# Patient Record
Sex: Female | Born: 1960 | Race: White | Hispanic: No | Smoking: Never smoker
Health system: Southern US, Community
[De-identification: ages and names within clinical notes are randomized; demographics above are authoritative.]

## PROBLEM LIST (undated history)

## (undated) DIAGNOSIS — F32A Depression, unspecified: Secondary | ICD-10-CM

## (undated) DIAGNOSIS — G35 Multiple sclerosis: Secondary | ICD-10-CM

## (undated) DIAGNOSIS — F329 Major depressive disorder, single episode, unspecified: Secondary | ICD-10-CM

## (undated) DIAGNOSIS — I82409 Acute embolism and thrombosis of unspecified deep veins of unspecified lower extremity: Secondary | ICD-10-CM

## (undated) DIAGNOSIS — E039 Hypothyroidism, unspecified: Secondary | ICD-10-CM

## (undated) HISTORY — DX: Hypothyroidism, unspecified: E03.9

## (undated) HISTORY — DX: Depression, unspecified: F32.A

## (undated) HISTORY — DX: Acute embolism and thrombosis of unspecified deep veins of unspecified lower extremity: I82.409

## (undated) HISTORY — DX: Major depressive disorder, single episode, unspecified: F32.9

## (undated) HISTORY — DX: Multiple sclerosis: G35

---

## 2014-09-04 ENCOUNTER — Non-Acute Institutional Stay (SKILLED_NURSING_FACILITY): Payer: Medicare (Managed Care) | Admitting: Internal Medicine

## 2014-09-04 ENCOUNTER — Encounter: Payer: Self-pay | Admitting: Internal Medicine

## 2014-09-04 DIAGNOSIS — E038 Other specified hypothyroidism: Secondary | ICD-10-CM

## 2014-09-04 DIAGNOSIS — I82409 Acute embolism and thrombosis of unspecified deep veins of unspecified lower extremity: Secondary | ICD-10-CM | POA: Diagnosis not present

## 2014-09-04 DIAGNOSIS — F329 Major depressive disorder, single episode, unspecified: Secondary | ICD-10-CM | POA: Diagnosis not present

## 2014-09-04 DIAGNOSIS — E039 Hypothyroidism, unspecified: Secondary | ICD-10-CM | POA: Insufficient documentation

## 2014-09-04 DIAGNOSIS — E034 Atrophy of thyroid (acquired): Secondary | ICD-10-CM

## 2014-09-04 DIAGNOSIS — F32A Depression, unspecified: Secondary | ICD-10-CM | POA: Insufficient documentation

## 2014-09-04 DIAGNOSIS — G35 Multiple sclerosis: Secondary | ICD-10-CM | POA: Diagnosis not present

## 2014-09-04 NOTE — Assessment & Plan Note (Addendum)
Pt receives daily copaxone; need to get her set up with neurology/MS provider; on baclofen, valium also

## 2014-09-04 NOTE — Progress Notes (Signed)
MRN: [id number] Name: Jessica Chung  Sex: female Age: 54 y.o. DOB: [birth year]  [facility] Facility/Room:419 Level Of Care: SNF Provider: Merrilee Seashore D Emergency Contacts: No emergency contact information on file.  Code Status:   Allergies: Review of patient's allergies indicates not on file.  Chief Complaint  Patient presents with  . New Admit To SNF    HPI: Patient is 54 y.o. female with MS who is being admitted to SNF from [place] as a resident.  Past Medical History  Diagnosis Date  . MS (multiple sclerosis)   . Hypothyroid   . Depression   . DVT (deep venous thrombosis)     History reviewed. No pertinent past surgical history.    Medication List       This list is accurate as of: 09/04/14 11:59 PM.  Always use your most recent med list.               aspirin 81 MG tablet  Take 81 mg by mouth daily.     baclofen 20 MG tablet  Commonly known as:  LIORESAL  Take 20 mg by mouth at bedtime.     BENEFIBER Powd  Take by mouth 3 (three) times daily.     Calcium Carbonate-Vitamin D 600-400 MG-UNIT per tablet  Take 1 tablet by mouth.     Cranberry Fruit Concentrate 12600 MG Caps  Take 50,000 mg by mouth daily.     diazepam 5 MG tablet  Commonly known as:  VALIUM  Take 5 mg by mouth daily.     docusate sodium 100 MG capsule  Commonly known as:  COLACE  Take 100 mg by mouth 2 (two) times daily.     Fish Oil 1000 MG Caps  Take by mouth daily.     FLUoxetine 10 MG capsule  Commonly known as:  PROZAC  Take 10 mg by mouth daily.     glatiramer 20 MG/ML Sosy injection  Commonly known as:  COPAXONE  Inject 20 mg into the skin daily.     levothyroxine 150 MCG tablet  Commonly known as:  SYNTHROID, LEVOTHROID  Take 150 mcg by mouth daily before breakfast.     Melatonin 3 MG Caps  Take by mouth at bedtime.     multivitamin with minerals tablet  Take 1 tablet by mouth daily.     polyethylene glycol packet  Commonly known as:  MIRALAX /  GLYCOLAX  Take 17 g by mouth daily.     warfarin 10 MG tablet  Commonly known as:  COUMADIN  Take 10 mg by mouth daily.     warfarin 2.5 MG tablet  Commonly known as:  COUMADIN  Take 2.5 mg by mouth daily. Mon and Fri for total 12.5 mg        Meds ordered this encounter  Medications  . warfarin (COUMADIN) 10 MG tablet    Sig: Take 10 mg by mouth daily.   Marland Kitchen warfarin (COUMADIN) 2.5 MG tablet    Sig: Take 2.5 mg by mouth daily. Mon and Fri for total 12.5 mg  . aspirin 81 MG tablet    Sig: Take 81 mg by mouth daily.  . diazepam (VALIUM) 5 MG tablet    Sig: Take 5 mg by mouth daily.  Marland Kitchen glatiramer (COPAXONE) 20 MG/ML SOSY injection    Sig: Inject 20 mg into the skin daily.  . Calcium Carbonate-Vitamin D 600-400 MG-UNIT per tablet    Sig: Take 1 tablet by mouth.  Marland Kitchen  Wheat Dextrin (BENEFIBER) POWD    Sig: Take by mouth 3 (three) times daily.  . baclofen (LIORESAL) 20 MG tablet    Sig: Take 20 mg by mouth at bedtime.  . Multiple Vitamins-Minerals (MULTIVITAMIN WITH MINERALS) tablet    Sig: Take 1 tablet by mouth daily.  Marland Kitchen levothyroxine (SYNTHROID, LEVOTHROID) 150 MCG tablet    Sig: Take 150 mcg by mouth daily before breakfast.  . FLUoxetine (PROZAC) 10 MG capsule    Sig: Take 10 mg by mouth daily.  . Omega-3 Fatty Acids (FISH OIL) 1000 MG CAPS    Sig: Take by mouth daily.  Marland Kitchen docusate sodium (COLACE) 100 MG capsule    Sig: Take 100 mg by mouth 2 (two) times daily.  . Melatonin 3 MG CAPS    Sig: Take by mouth at bedtime.  . Cranberry Fruit Concentrate 12600 MG CAPS    Sig: Take 50,000 mg by mouth daily.  . polyethylene glycol (MIRALAX / GLYCOLAX) packet    Sig: Take 17 g by mouth daily.     There is no immunization history on file for this patient.  History  Substance Use Topics  . Smoking status: Never Smoker   . Smokeless tobacco: Not on file  . Alcohol Use: No    Family history is noncontributory    Review of Systems  DATA OBTAINED: from patient, medical record,  family member GENERAL:  no fevers, fatigue, appetite changes SKIN: No itching, rash or wounds EYES: No eye pain, redness, discharge EARS: No earache, tinnitus, change in hearing NOSE: No congestion, drainage or bleeding  MOUTH/THROAT: No mouth or tooth pain, No sore throat RESPIRATORY: No cough, wheezing, SOB CARDIAC: No chest pain, palpitations, lower extremity edema  GI: No abdominal pain, No N/V/D or constipation, No heartburn or reflux  GU: No dysuria, frequency or urgency, or incontinence  MUSCULOSKELETAL: No unrelieved bone/joint pain NEUROLOGIC: No headache, dizziness or focal weakness;sister says she has some memory problems PSYCHIATRIC: No overt anxiety or sadness, No behavior issue.   There were no vitals filed for this visit.  Physical Exam  GENERAL APPEARANCE: Alert, conversant,  No acute distress.  SKIN: No diaphoresis rash HEAD: Normocephalic, atraumatic  EYES: Conjunctiva/lids clear. Pupils round, reactive. EOMs intact.  EARS: External exam WNL, canals clear. Hearing grossly normal.  NOSE: No deformity or discharge.  MOUTH/THROAT: Lips w/o lesions  RESPIRATORY: Breathing is even, unlabored. Lung sounds are clear   CARDIOVASCULAR: Heart RRR no murmurs, rubs or gallops. No peripheral edema.   GASTROINTESTINAL: Abdomen is soft, non-tender, not distended w/ normal bowel sounds. GENITOURINARY: Bladder non tender, not distended  MUSCULOSKELETAL: No abnormal joints or musculature NEUROLOGIC:  Cranial nerves 2-12 grossly intact.No LE movement PSYCHIATRIC: Mood and affect appropriate to situation, no behavioral issues  Patient Active Problem List   Diagnosis Date Noted  . MS (multiple sclerosis) 09/04/2014  . DVT (deep venous thrombosis) 09/04/2014  . Hypothyroidism 09/04/2014  . Depression 09/04/2014        Assessment and Plan  MS (multiple sclerosis) Pt receives daily copaxone; need to get her set up with neurology/MS provider; on baclofen, valium also   DVT  (deep venous thrombosis) Coumadin as prophylaxis   Hypothyroidism 150 mcg daily as replacement   Depression Chronic illness; prozac 10 mg daily     Margit Hanks, MD

## 2014-09-04 NOTE — Assessment & Plan Note (Signed)
Chronic illness; prozac 10 mg daily

## 2014-09-04 NOTE — Assessment & Plan Note (Signed)
150 mcg daily as replacement

## 2014-09-04 NOTE — Assessment & Plan Note (Addendum)
Coumadin as prophylaxis;pt does not know when DVT was dx;hopefully her neuro in [place] has it on a note that will be sent to Neuro here in [place]

## 2014-09-16 ENCOUNTER — Encounter: Payer: Self-pay | Admitting: Internal Medicine

## 2014-09-23 ENCOUNTER — Non-Acute Institutional Stay (SKILLED_NURSING_FACILITY): Payer: Medicare (Managed Care) | Admitting: Internal Medicine

## 2014-09-23 DIAGNOSIS — R21 Rash and other nonspecific skin eruption: Secondary | ICD-10-CM | POA: Diagnosis not present

## 2014-09-23 DIAGNOSIS — E038 Other specified hypothyroidism: Secondary | ICD-10-CM | POA: Diagnosis not present

## 2014-09-23 DIAGNOSIS — G35 Multiple sclerosis: Secondary | ICD-10-CM

## 2014-09-23 NOTE — Progress Notes (Signed)
Patient ID: Jessica Chung, female   DOB: [birth year], 54 y.o.   MRN: [id number] MRN: [id number] Name: Jessica Chung  Sex: female Age: 54 y.o. DOB: Dec 31, 1960  [facility] Facility/Room:419 Level Of Care: SNF Provider: Roena Malady Emergency Contacts: Extended Emergency Contact Information Primary Emergency Contact: [facility] Address: [address] [place], [place] [address] [place] Home Phone: [phone] Mobile Phone: [phone] Relation: Sister Secondary Emergency Contact: Clelia Schaumann [place] Home Phone: [phone] Mobile Phone: [phone] Relation: Mother  Code Status:   Allergies: Review of patient's allergies indicates not on file.  Chief Complaint  Patient presents with  . Acute Visit   secondary to facial rash  HPI: Patient is 54 y.o. female with MS who was recently admitted to this facility from another facility in [place] was transferred apparently be closer to family  Apparently over the weekend she developed a fairly significant facial rash with scaling-this does not really hurt there has been no itching according the patient but it it does appear fairly significant-.  According to staff and her daughter this actually appears a little improved compared to yesterday.  Vital signs are stable she's been afebrile she does not really have any complaints about this.  Apparently there's been no different lotion soap or topical treatment to her face.  .  .  Past Medical History  Diagnosis Date  . MS (multiple sclerosis)   . Hypothyroid   . Depression   . DVT (deep venous thrombosis)     No past surgical history on file.    Medication List       This list is accurate as of: 09/23/14 11:59 PM.  Always use your most recent med list.               aspirin 81 MG tablet  Take 81 mg by mouth daily.     baclofen 20 MG tablet  Commonly known as:  LIORESAL  Take 20 mg by mouth at bedtime.      BENEFIBER Powd  Take by mouth 3 (three) times daily.     Calcium Carbonate-Vitamin D 600-400 MG-UNIT per tablet  Take 1 tablet by mouth.     Cranberry Fruit Concentrate 12600 MG Caps  Take 50,000 mg by mouth daily.     diazepam 5 MG tablet  Commonly known as:  VALIUM  Take 5 mg by mouth daily.     docusate sodium 100 MG capsule  Commonly known as:  COLACE  Take 100 mg by mouth 2 (two) times daily.     Fish Oil 1000 MG Caps  Take by mouth daily.     FLUoxetine 10 MG capsule  Commonly known as:  PROZAC  Take 10 mg by mouth daily.     glatiramer 20 MG/ML Sosy injection  Commonly known as:  COPAXONE  Inject 20 mg into the skin daily.     levothyroxine 150 MCG tablet  Commonly known as:  SYNTHROID, LEVOTHROID  Take 150 mcg by mouth daily before breakfast.     Melatonin 3 MG Caps  Take by mouth at bedtime.     multivitamin with minerals tablet  Take 1 tablet by mouth daily.     polyethylene glycol packet  Commonly known as:  MIRALAX / GLYCOLAX  Take 17 g by mouth daily.     warfarin 10 MG tablet  Commonly known as:  COUMADIN  Take 10 mg by  mouth daily.     warfarin 2.5 MG tablet  Commonly known as:  COUMADIN  Take 2.5 mg by mouth daily. Mon and Fri for total 12.5 mg        No orders of the defined types were placed in this encounter.     There is no immunization history on file for this patient.  History  Substance Use Topics  . Smoking status: Never Smoker   . Smokeless tobacco: Not on file  . Alcohol Use: No    Family history is noncontributory    Review of Systems  DATA OBTAINED: from patient, medical record, family member GENERAL:  no fevers, fatigue, appetite changes SKIN: No itching, and has a significant facial rash noted above there is no itching EYES: No eye pain, redness, discharge EARS: No earache, tinnitus, change in hearing NOSE: No congestion, drainage or bleeding  MOUTH/THROAT: No mouth or tooth pain, No sore throat RESPIRATORY: No  cough, wheezing, SOB CARDIAC: No chest pain, palpitations, lower extremity edema  GI: No abdominal pain, No N/V/D or constipation, No heartburn or reflux  GU: No dysuria, frequency or urgency, or incontinence  MUSCULOSKELETAL: No unrelieved bone/joint pain NEUROLOGIC: No headache, dizziness or focal weakness;sister says she has some memory problems PSYCHIATRIC: No overt anxiety or sadness, No behavior issue.     Physical Exam Vital signs are stable she is afebrile GENERAL APPEARANCE: Alert, conversant,  No acute distress.  SKIN: No diaphoresis --she has an erythematous rash that extends from under the orbital area bilaterally to the jaw line- does not really extend to her fore head or neck-there is quite a bit of scaling here there is no drainage or bleeding HEAD: Normocephalic, atraumatic  EYES: Conjunctiva/lids clear. Pupils round, reactive. EOMs intact.  EARS: External exam WNL, canals clear. Hearing grossly normal.  NOSE: No deformity or discharge.  MOUTH/THROAT: Lips w/o lesions  RESPIRATORY: Breathing is even, unlabored. Lung sounds are clear   CARDIOVASCULAR: Heart RRR no murmurs, rubs or gallops. Has baseline  Trace  lower extremity edema obese changes-  GASTROINTESTINAL: Abdomen is soft, non-tender, not distended w/ normal bowel sounds. GENITOURINARY: Bladder non tender, not distended  MUSCULOSKELETAL: No abnormal joints or musculature NEUROLOGIC:  Cranial nerves 2-12 grossly intact.No LE movement PSYCHIATRIC: Mood and affect appropriate to situation, no behavioral issues  Patient Active Problem List   Diagnosis Date Noted  . Rash and nonspecific skin eruption 09/23/2014  . MS (multiple sclerosis) 09/04/2014  . DVT (deep venous thrombosis) 09/04/2014  . Hypothyroidism 09/04/2014  . Depression 09/04/2014     Labs.  09/05/2014.  WBC 7.6 hemoglobin 11.9 platelets 246.  Hemoglobin A1c 5.5.  Sodium 140 potassium 3.9 BUN 18 creatinine  0.8.  TSH-4.153     Assessment and Plan  #1-facial rash-unknown etiology-this is quite puzzling-I did discuss this with her sister at bedside-family also contacted her mother in [place]-according to her mother she actually has had this [place]-it resolved apparently spontaneously-no etiology was found despite apparently being followed by primary care provider in [place].  At this point will write orders to monitor this with vital signs every shift for 24 hours to keep an eye on this-will order a dermatology consult and again monitor for any changes any sign that this could be cellulitic although this does not appear to be the case currently.    2 MS-this appears to be stable she is on baclofen as well as Valium andCapoxone    #3-hypothyroidism-recent TSH within normal  limits she is on Synthroid.  [id number] note greater than 30 minutes spent assessing patient-reviewing her chart-discussing her status with family at bedside-and coordinating and formulating a plan of care-of note greater than 50% of time spent coordinating plan of care with family input      ,

## 2014-10-07 ENCOUNTER — Inpatient Hospital Stay (HOSPITAL_COMMUNITY): Payer: Medicare (Managed Care)

## 2014-10-07 ENCOUNTER — Emergency Department (HOSPITAL_COMMUNITY): Payer: Medicare (Managed Care) | Admitting: Anesthesiology

## 2014-10-07 ENCOUNTER — Inpatient Hospital Stay (HOSPITAL_COMMUNITY)
Admission: EM | Admit: 2014-10-07 | Discharge: 2014-10-23 | DRG: 004 | Disposition: E | Payer: Medicare (Managed Care) | Attending: Otolaryngology | Admitting: Otolaryngology

## 2014-10-07 ENCOUNTER — Encounter (HOSPITAL_COMMUNITY): Admission: EM | Disposition: E | Payer: Self-pay | Source: Home / Self Care | Attending: Otolaryngology

## 2014-10-07 ENCOUNTER — Other Ambulatory Visit (HOSPITAL_COMMUNITY): Payer: Medicare Other

## 2014-10-07 DIAGNOSIS — Z66 Do not resuscitate: Secondary | ICD-10-CM | POA: Diagnosis present

## 2014-10-07 DIAGNOSIS — Z515 Encounter for palliative care: Secondary | ICD-10-CM

## 2014-10-07 DIAGNOSIS — T7809XA Anaphylactic reaction due to other food products, initial encounter: Secondary | ICD-10-CM | POA: Diagnosis present

## 2014-10-07 DIAGNOSIS — Z419 Encounter for procedure for purposes other than remedying health state, unspecified: Secondary | ICD-10-CM

## 2014-10-07 DIAGNOSIS — I469 Cardiac arrest, cause unspecified: Secondary | ICD-10-CM | POA: Diagnosis present

## 2014-10-07 DIAGNOSIS — T17420A Food in trachea causing asphyxiation, initial encounter: Secondary | ICD-10-CM

## 2014-10-07 DIAGNOSIS — R0902 Hypoxemia: Secondary | ICD-10-CM | POA: Diagnosis present

## 2014-10-07 DIAGNOSIS — E872 Acidosis: Secondary | ICD-10-CM | POA: Diagnosis present

## 2014-10-07 DIAGNOSIS — R579 Shock, unspecified: Secondary | ICD-10-CM | POA: Diagnosis present

## 2014-10-07 DIAGNOSIS — R092 Respiratory arrest: Secondary | ICD-10-CM | POA: Diagnosis present

## 2014-10-07 DIAGNOSIS — T782XXA Anaphylactic shock, unspecified, initial encounter: Secondary | ICD-10-CM | POA: Insufficient documentation

## 2014-10-07 DIAGNOSIS — J9601 Acute respiratory failure with hypoxia: Secondary | ICD-10-CM | POA: Diagnosis not present

## 2014-10-07 DIAGNOSIS — G35 Multiple sclerosis: Secondary | ICD-10-CM | POA: Diagnosis present

## 2014-10-07 DIAGNOSIS — T17920A Food in respiratory tract, part unspecified causing asphyxiation, initial encounter: Secondary | ICD-10-CM | POA: Diagnosis present

## 2014-10-07 DIAGNOSIS — J398 Other specified diseases of upper respiratory tract: Secondary | ICD-10-CM | POA: Diagnosis present

## 2014-10-07 DIAGNOSIS — E039 Hypothyroidism, unspecified: Secondary | ICD-10-CM | POA: Diagnosis present

## 2014-10-07 HISTORY — PX: TRACHEOSTOMY REVISION: SHX6133

## 2014-10-07 LAB — BLOOD GAS, ARTERIAL
Acid-Base Excess: 4 mmol/L — ABNORMAL HIGH (ref 0.0–2.0)
Bicarbonate: 36 mEq/L — ABNORMAL HIGH (ref 20.0–24.0)
FIO2: 1 %
O2 SAT: 42.5 %
PH ART: 6.956 — AB (ref 7.350–7.450)
Patient temperature: 98.6
TCO2: 41.2 mmol/L (ref 0–100)
pO2, Arterial: 40 mmHg — ABNORMAL LOW (ref 80.0–100.0)

## 2014-10-07 SURGERY — REVISION, STOMA, TRACHEA
Anesthesia: General | Site: Neck

## 2014-10-07 MED ORDER — ROCURONIUM BROMIDE 100 MG/10ML IV SOLN
INTRAVENOUS | Status: DC | PRN
  Administered 2014-10-07 (×2): 50 mg via INTRAVENOUS

## 2014-10-07 MED ORDER — SODIUM BICARBONATE 8.4 % IV SOLN
INTRAVENOUS | Status: DC | PRN
  Administered 2014-10-07 (×10): 50 meq via INTRAVENOUS

## 2014-10-07 MED ORDER — EPINEPHRINE HCL 0.1 MG/ML IJ SOSY
PREFILLED_SYRINGE | INTRAMUSCULAR | Status: AC
  Filled 2014-10-07: qty 20

## 2014-10-07 MED ORDER — EPHEDRINE SULFATE 50 MG/ML IJ SOLN
INTRAMUSCULAR | Status: AC
  Filled 2014-10-07: qty 1

## 2014-10-07 MED ORDER — PHENYLEPHRINE 40 MCG/ML (10ML) SYRINGE FOR IV PUSH (FOR BLOOD PRESSURE SUPPORT)
PREFILLED_SYRINGE | INTRAVENOUS | Status: AC
  Filled 2014-10-07: qty 10

## 2014-10-07 MED ORDER — MORPHINE SULFATE 2 MG/ML IJ SOLN
INTRAMUSCULAR | Status: AC
  Filled 2014-10-07: qty 1

## 2014-10-07 MED ORDER — SODIUM CHLORIDE 0.9 % IV SOLN
INTRAVENOUS | Status: DC | PRN
  Administered 2014-10-07: 14:00:00 via INTRAVENOUS

## 2014-10-07 MED ORDER — LACTATED RINGERS IV SOLN
INTRAVENOUS | Status: DC | PRN
  Administered 2014-10-07 (×2): via INTRAVENOUS

## 2014-10-07 MED ORDER — MORPHINE SULFATE 4 MG/ML IJ SOLN: 5.0000 mg | INTRAMUSCULAR | Status: DC | PRN

## 2014-10-07 MED ORDER — HEMOSTATIC AGENTS (NO CHARGE) OPTIME
TOPICAL | Status: DC | PRN
  Administered 2014-10-07: 1 via TOPICAL

## 2014-10-07 MED ORDER — DEXTROSE 5 % IV SOLN
0.5000 ug/min | INTRAVENOUS | Status: DC
  Administered 2014-10-07: 8 ug/min via INTRAVENOUS
  Filled 2014-10-07: qty 4

## 2014-10-07 MED ORDER — STERILE WATER FOR INJECTION IJ SOLN
INTRAMUSCULAR | Status: AC
Start: 2014-10-07 — End: 2014-10-07
  Filled 2014-10-07: qty 10

## 2014-10-07 MED ORDER — ATROPINE SULFATE 0.1 MG/ML IJ SOLN
INTRAMUSCULAR | Status: AC
Start: 2014-10-07 — End: 2014-10-07
  Filled 2014-10-07: qty 20

## 2014-10-07 MED ORDER — EPINEPHRINE HCL 0.1 MG/ML IJ SOSY
PREFILLED_SYRINGE | INTRAMUSCULAR | Status: DC | PRN
  Administered 2014-10-07: 1 mg via INTRAVENOUS
  Administered 2014-10-07 (×2): 0.5 mg via INTRAVENOUS
  Administered 2014-10-07: 1 mg via INTRAVENOUS

## 2014-10-07 MED ORDER — CALCIUM CHLORIDE 10 % IV SOLN
INTRAVENOUS | Status: AC
  Filled 2014-10-07: qty 10

## 2014-10-07 MED ORDER — LIDOCAINE HCL (CARDIAC) 20 MG/ML IV SOLN
INTRAVENOUS | Status: AC
  Filled 2014-10-07: qty 10

## 2014-10-07 MED ORDER — ROCURONIUM BROMIDE 50 MG/5ML IV SOLN
INTRAVENOUS | Status: AC
  Filled 2014-10-07: qty 2

## 2014-10-07 MED ORDER — EPHEDRINE SULFATE 50 MG/ML IJ SOLN
INTRAMUSCULAR | Status: DC | PRN
  Administered 2014-10-07: 25 mg via INTRAVENOUS

## 2014-10-07 MED ORDER — LACTATED RINGERS IV SOLN
INTRAVENOUS | Status: DC | PRN
  Administered 2014-10-07 (×2): via INTRAVENOUS

## 2014-10-07 MED ORDER — SODIUM BICARBONATE 8.4 % IV SOLN
INTRAVENOUS | Status: AC
  Filled 2014-10-07: qty 50

## 2014-10-07 MED ORDER — MORPHINE SULFATE 2 MG/ML IJ SOLN
INTRAMUSCULAR | Status: AC
  Administered 2014-10-07: 2 mg via INTRAVENOUS
  Filled 2014-10-07: qty 1

## 2014-10-07 MED ORDER — VECURONIUM BROMIDE 10 MG IV SOLR
INTRAVENOUS | Status: AC
  Filled 2014-10-07: qty 30

## 2014-10-07 SURGICAL SUPPLY — 57 items
BLADE 10 SAFETY STRL DISP (BLADE) ×3 IMPLANT
BLADE 11 SAFETY STRL DISP (BLADE) IMPLANT
BLADE 15 SAFETY STRL DISP (BLADE) IMPLANT
BLADE SURG 15 STRL LF DISP TIS (BLADE) IMPLANT
BLADE SURG 15 STRL SS (BLADE)
BLADE SURG ROTATE 9660 (MISCELLANEOUS) IMPLANT
CANISTER SUCTION 2500CC (MISCELLANEOUS) ×3 IMPLANT
CLEANER TIP ELECTROSURG 2X2 (MISCELLANEOUS) ×3 IMPLANT
COVER SURGICAL LIGHT HANDLE (MISCELLANEOUS) ×3 IMPLANT
DECANTER SPIKE VIAL GLASS SM (MISCELLANEOUS) ×3 IMPLANT
DRAPE PROXIMA HALF (DRAPES) IMPLANT
DRAPE U-SHAPE 76X120 STRL (DRAPES) ×3 IMPLANT
ELECT COATED BLADE 2.86 ST (ELECTRODE) ×3 IMPLANT
ELECT REM PT RETURN 9FT ADLT (ELECTROSURGICAL) ×3
ELECTRODE REM PT RTRN 9FT ADLT (ELECTROSURGICAL) ×1 IMPLANT
GAUZE SPONGE 4X4 12PLY STRL (GAUZE/BANDAGES/DRESSINGS) ×3 IMPLANT
GAUZE SPONGE 4X4 16PLY XRAY LF (GAUZE/BANDAGES/DRESSINGS) IMPLANT
GAUZE XEROFORM 5X9 LF (GAUZE/BANDAGES/DRESSINGS) IMPLANT
GLOVE BIO SURGEON STRL SZ7.5 (GLOVE) ×3 IMPLANT
GLOVE BIO SURGEON STRL SZ8 (GLOVE) ×3 IMPLANT
GLOVE BIOGEL PI IND STRL 7.5 (GLOVE) ×1 IMPLANT
GLOVE BIOGEL PI IND STRL 8 (GLOVE) ×1 IMPLANT
GLOVE BIOGEL PI INDICATOR 7.5 (GLOVE) ×2
GLOVE BIOGEL PI INDICATOR 8 (GLOVE) ×2
GLOVE BIOGEL PI ORTHO PRO SZ7 (GLOVE) ×2
GLOVE ECLIPSE 7.5 STRL STRAW (GLOVE) ×3 IMPLANT
GLOVE PI ORTHO PRO STRL SZ7 (GLOVE) ×1 IMPLANT
GLOVE SURG SS PI 6.5 STRL IVOR (GLOVE) ×3 IMPLANT
GOWN STRL REUS W/ TWL LRG LVL3 (GOWN DISPOSABLE) ×3 IMPLANT
GOWN STRL REUS W/TWL LRG LVL3 (GOWN DISPOSABLE) ×6
HEMOSTAT SURGICEL 2X14 (HEMOSTASIS) ×3 IMPLANT
HOLDER TRACH TUBE VELCRO 19.5 (MISCELLANEOUS) IMPLANT
KIT BASIN OR (CUSTOM PROCEDURE TRAY) ×3 IMPLANT
KIT ROOM TURNOVER OR (KITS) ×3 IMPLANT
KIT SUCTION CATH 14FR (SUCTIONS) IMPLANT
NEEDLE HYPO 25GX1X1/2 BEV (NEEDLE) ×3 IMPLANT
NS IRRIG 1000ML POUR BTL (IV SOLUTION) ×3 IMPLANT
PAD ARMBOARD 7.5X6 YLW CONV (MISCELLANEOUS) ×6 IMPLANT
PENCIL BUTTON HOLSTER BLD 10FT (ELECTRODE) IMPLANT
SPONGE DRAIN TRACH 4X4 STRL 2S (GAUZE/BANDAGES/DRESSINGS) ×3 IMPLANT
SPONGE INTESTINAL PEANUT (DISPOSABLE) ×6 IMPLANT
SUT CHROMIC 3 0 SH 27 (SUTURE) IMPLANT
SUT ETHILON 2 0 FS 18 (SUTURE) ×6 IMPLANT
SUT PROLENE 2 0 SH DA (SUTURE) ×6 IMPLANT
SUT SILK 3 0 (SUTURE) ×4
SUT SILK 3-0 18XBRD TIE 12 (SUTURE) ×2 IMPLANT
SYR 20CC LL (SYRINGE) IMPLANT
SYR BULB 3OZ (MISCELLANEOUS) IMPLANT
SYR CONTROL 10ML LL (SYRINGE) IMPLANT
SYRINGE 20CC LL (MISCELLANEOUS) ×3 IMPLANT
TOWEL OR 17X24 6PK STRL BLUE (TOWEL DISPOSABLE) ×6 IMPLANT
TRAY ENT MC OR (CUSTOM PROCEDURE TRAY) ×3 IMPLANT
TUBE CONNECTING 12'X1/4 (SUCTIONS) ×1
TUBE CONNECTING 12X1/4 (SUCTIONS) ×2 IMPLANT
TUBE TRACH SHILEY  6 DIST  CUF (TUBING) ×3 IMPLANT
TUBE TRACH SHILEY 8 DIST CUF (TUBING) ×3 IMPLANT
WATER STERILE IRR 1000ML POUR (IV SOLUTION) ×3 IMPLANT

## 2014-10-09 ENCOUNTER — Encounter (HOSPITAL_COMMUNITY): Payer: Self-pay | Admitting: Otolaryngology

## 2014-10-09 MED FILL — Medication: Qty: 1 | Status: AC

## 2014-10-23 NOTE — Progress Notes (Signed)
Anesthesiology Note:  As noted 54 year old female with multiple sclerosis and a resident of a skilled nursing facility  evidently suffered an anaphylactic reaction and aspirated food. EMS called,  attempted intubation and emergency cricothyroidotomy were unsuccessful. Patient transported to [facility]. Attempts at intubation by anesthesia personnel x 4 in ER were unsuccessful. Dr. Suszanne Conners then performed an emergency cricothyroidotomy in the ER and a 6.0 ETT was placed.  She was transported to  the OR and suffered a cardiac arrest en route in the elevator. Upon arrival in OR patient was cyanotic with copious frothy pink fluid and food particles in endotracheal tube. Active CPR efforts underway. Ventilation  very difficult. Cricothyroidotomy converted to tracheostomy in OR by Dr. Suszanne Conners.   Despite agressive resusitative efforts and attempts to optimize ventilation, patient remained severely hypoxemic, hypercarbic, and acidotic. ICU ventilator brought to OR in attempt to improve oxygenation and ventilation. CXR (-) for pneumothorax.   Patient transported to [facility]M. I notified the family  of the patient's dire condition and the patient's sister (present in ICU) and parents (via telephone)  all agreed that  further aggressive resusitative efforts should not be continued.   Patient then expired in ICU with her sister and sister in law at bedside. Dr. Tyson Alias and ICU nursing staff were present as well.  Kipp Brood, MD

## 2014-10-23 NOTE — Anesthesia Preprocedure Evaluation (Addendum)
Anesthesia Evaluation   Patient unresponsive    Reviewed: Unable to perform ROS - Chart review onlyPreop documentation limited or incomplete due to emergent nature of procedure.  Airway Mallampati: Trach       Dental   Pulmonary  + rhonchi   + decreased breath sounds      Cardiovascular Rhythm:Regular Rate:Tachycardia     Neuro/Psych    GI/Hepatic   Endo/Other    Renal/GU      Musculoskeletal   Abdominal   Peds  Hematology   Anesthesia Other Findings   Reproductive/Obstetrics                            Anesthesia Physical Anesthesia Plan  ASA: V and emergent  Anesthesia Plan: General   Post-op Pain Management:    Induction:   Airway Management Planned:   Additional Equipment:   Intra-op Plan:   Post-operative Plan:   Informed Consent:   Only emergency history available  Plan Discussed with: CRNA and Anesthesiologist  Anesthesia Plan Comments:         Anesthesia Quick Evaluation

## 2014-10-23 NOTE — ED Notes (Signed)
Pt to ED from [facility] via [facility] -- pt had anaphylactic rxn after eating chicken and rice-- staff attempted heimlich and finger sweeps without success. EMS started IV #1 20 g in left hand--- pt received 0.15 EPI x 2 IM , Solumedrol  IV 0.5mg  05/998 IV. Benadryl  IV. O2 sats were 95%-- EMS attempted to intubate-- airway swollen -- unsuccessful, attempted nasal trumpet -- unsuccessful, attempted cric -- unsuccessful.  On arrival to ED-- Anesthesia and 3 CRNAs at bedside, with resp therapy, EDP at bedside. -- pt was trached per Dr. Suszanne Conners- 6.0 ETT placed. Pt transported to OR with OR staff, Dr. Suszanne Conners, Harlene Salts, EMT, and this nurse. Pt lost pulses while in the elevator-- Compressions started-- continued into OR rm 8. Pt care handed over to OR staff.

## 2014-10-23 NOTE — Anesthesia Postprocedure Evaluation (Signed)
  Anesthesia Post-op Note  Patient: Jessica Chung  Procedure(s) Performed: Procedure(s): TRACHEOSTOMY REVISION (N/A)  Patient Location: SICU  Anesthesia Type:General  Level of Consciousness: Patient expired  Airway and Oxygen Therapy: None  Post-op Pain: None  Post-op Assessment: patient expired following sugery  Post-op Vital Signs: None  Last Vitals:  Filed Vitals:   Oct 08, 2014 1338  BP: 120/79  Pulse: 130  Resp: 0    Complications: Patient deceased

## 2014-10-23 NOTE — Progress Notes (Signed)
Utilization Review Completed.Jessica Chung T5/16/2016  

## 2014-10-23 NOTE — Op Note (Signed)
DATE OF PROCEDURE:  10/16/14                              OPERATIVE REPORT  SURGEON:  Newman Pies, MD  PREOPERATIVE DIAGNOSES: 1. Acute respiratory distress 2. Food aspiration  POSTOPERATIVE DIAGNOSES: 1. Acute respiratory distress 2. Food aspiration  PROCEDURE PERFORMED:  Tracheostomy  ANESTHESIA:  General endotracheal tube anesthesia.  COMPLICATIONS:  None.  ESTIMATED BLOOD LOSS:  50ml  INDICATION FOR PROCEDURE:  Jessica Chung is a 54 y.o. female who was emergently transported to the [facility] emergency room by EMS. She was noted to be cyanotic at the nursing home, secondary to food aspiration. Attempts to intubate the patient was unsuccessful. EMS also attempted to perform a cricothyroidotomy without success. An emergency cricothyroidotomy was performed in the emergency room. The patient remained cyanotic with poor CO2 saturation. The decision was made for patient to undergo a tracheostomy tube placement in the operating room.  DESCRIPTION:  The patient was taken to the operating room and placed supine on the operating table. A central line and an artline were placed by anethesia. The neck incision was carefully explored. The thyroid isthmus was ligated at midline and retracted laterally, exposing the anterior tracheal ring. A new tracheal window was placed at the third tracheal ring. The endotracheal tube was withdrawn. A #6 Shiley cuffed tracheostomy tube was placed. Good CO2 return was noted. The position of the trach tube was confirmed using a flexible scope.  OPERATIVE FINDINGS:  Acute respiratory distress due to aspiration.   SPECIMEN:  None.  FOLLOWUP CARE:  The patient will be transferred to ICU in critical condition.  Jessica Chung,SUI W Oct 16, 2014 3:08 PM

## 2014-10-23 NOTE — ED Provider Notes (Signed)
CSN: [id number]     Arrival date & time 2014/10/20  1346 History   First MD Initiated Contact with Patient 10/20/2014 1349     Chief Complaint  Patient presents with  . anaphylactic shock   . Respiratory Arrest     (Consider location/radiation/quality/duration/timing/severity/associated sxs/prior Treatment) HPI Comments: LEVEL 5 EXCEPTION 2/11 ACUITY  54 year old female history of MS comes after food aspiration. Patient is reported to be in a facility when she aspirated during lunch or questionable anaphylactic reaction. Unclear at this time. On EMS arrival per their report there is significant oral swelling and patient was having significant difficulty breathing. Patient was given Solu-Medrol and epinephrine and Benadryl IM and emergency brought to the emergency department. Failed needle cricothyrotomy in field. Patient also questionably in V. tach versus torsade's in field but unable to confirm with EMS. No further information is available.   Past Medical History  Diagnosis Date  . MS (multiple sclerosis)   . Hypothyroid   . Depression   . DVT (deep venous thrombosis)    No past surgical history on file. No family history on file. History  Substance Use Topics  . Smoking status: Never Smoker   . Smokeless tobacco: Not on file  . Alcohol Use: No   OB History    No data available     Review of Systems  Unable to perform ROS: Acuity of condition      Allergies  Review of patient's allergies indicates not on file.  Home Medications   Prior to Admission medications   Medication Sig Start Date End Date Taking? Authorizing Provider  aspirin 81 MG tablet Take 81 mg by mouth daily.    Historical Provider, MD  baclofen (LIORESAL) 20 MG tablet Take 20 mg by mouth at bedtime.    Historical Provider, MD  Calcium Carbonate-Vitamin D 600-400 MG-UNIT per tablet Take 1 tablet by mouth.    Historical Provider, MD  Cranberry Fruit Concentrate 12600 MG CAPS Take 50,000 mg by mouth daily.     Historical Provider, MD  diazepam (VALIUM) 5 MG tablet Take 5 mg by mouth daily.    Historical Provider, MD  docusate sodium (COLACE) 100 MG capsule Take 100 mg by mouth 2 (two) times daily.    Historical Provider, MD  FLUoxetine (PROZAC) 10 MG capsule Take 10 mg by mouth daily.    Historical Provider, MD  glatiramer (COPAXONE) 20 MG/ML SOSY injection Inject 20 mg into the skin daily.    Historical Provider, MD  levothyroxine (SYNTHROID, LEVOTHROID) 150 MCG tablet Take 150 mcg by mouth daily before breakfast.    Historical Provider, MD  Melatonin 3 MG CAPS Take by mouth at bedtime.    Historical Provider, MD  Multiple Vitamins-Minerals (MULTIVITAMIN WITH MINERALS) tablet Take 1 tablet by mouth daily.    Historical Provider, MD  Omega-3 Fatty Acids (FISH OIL) 1000 MG CAPS Take by mouth daily.    Historical Provider, MD  polyethylene glycol (MIRALAX / GLYCOLAX) packet Take 17 g by mouth daily.    Historical Provider, MD  warfarin (COUMADIN) 10 MG tablet Take 10 mg by mouth daily.     Historical Provider, MD  warfarin (COUMADIN) 2.5 MG tablet Take 2.5 mg by mouth daily. Mon and Fri for total 12.5 mg    Historical Provider, MD  Wheat Dextrin (BENEFIBER) POWD Take by mouth 3 (three) times daily.    Historical Provider, MD   BP 120/79 mmHg  Pulse 130  Resp 0 Physical Exam  Constitutional: She  appears distressed.  HENT:  Head: Atraumatic.  Eyes:  3 mm sluggish  Neck:  Minimal air movement with noted stridor  Cardiovascular:  Tachycardia  Pulmonary/Chest: Stridor present. She is in respiratory distress.  Minimal air movement throughout all lung fields  Abdominal: Soft. She exhibits no distension.  Musculoskeletal:  No gross deformity  Neurological:  GCS 3  Skin:  Cyanotic  Psychiatric:  Unable to assess    ED Course  Procedures (including critical care time) Labs Review Labs Reviewed  BLOOD GAS, ARTERIAL - Abnormal; Notable for the following:    pH, Arterial 6.956 (*)    pO2,  Arterial 40.0 (*)    Bicarbonate 36.0 (*)    Acid-Base Excess 4.0 (*)    All other components within normal limits    Imaging Review Dg Cervical Spine 1 View  Oct 26, 2014   CLINICAL DATA:  54 year old female with surgical history of tracheostomy.  EXAM: DG CERVICAL SPINE - 1 VIEW  COMPARISON:  Chest x-ray 2014-10-26  FINDINGS: Single operative cross-table lateral of the upper chest and cervical spine.  Tracheostomy tube projects at the anterior neck. Terminal aspect of the tracheostomy tube not well visualized secondary to overlying soft tissues of the chest.  IMPRESSION: Limited cross-table lateral of the neck demonstrates tracheostomy tube within the anterior cervical soft tissues. The terminal aspect of the tube is not well evaluated secondary to overlying soft tissues of the chest wall.  Please refer to the dictated operative report for full details of intraoperative findings and procedure.  Signed,  Yvone Neu. Loreta Ave, DO  [facility]  [facility]   Electronically Signed   By: Gilmer Mor D.O.   On: 10-26-14 15:20   Dg Chest Portable 1 View  10-26-14   CLINICAL DATA:  Verify tracheal tube placement. Decreasing oxygen saturations perioperatively.  EXAM: PORTABLE CHEST - 1 VIEW  COMPARISON:  None.  FINDINGS: There is an endotracheal tube or tracheostomy tube overlying the trachea at the level of the clavicles.  Diffuse severe bilateral airspace disease is present. Defibrillator pads overlie the chest. The distribution of airspace disease is most compatible with pulmonary edema. Cardiopericardial silhouette is obscured. There is no visible pneumothorax. LEFT subclavian central line is present with the tip in the upper to mid SVC.  IMPRESSION: 1. Tracheal tube is present at the level of the clavicles, overlying the tracheal air column. 2. Other support apparatus appears in satisfactory position. 3. Bilateral airspace disease with a distribution suggesting  pulmonary edema. Large aspiration considered less likely based on the pattern.   Electronically Signed   By: Andreas Newport M.D.   On: October 26, 2014 15:12     EKG Interpretation None      MDM  54 year old female on arrival to the emergency department stopping breathing, minimal air movement via trachea and in all lung fields. Patient was cyanotic but maintaining a pulse. Initial saturations 40s and 50s. Anesthesia and ENT surgery called. Initially attempted one endotracheal intubation. However anesthesia was unable to obtain secondary to swelling and food impactions. Given patient's worsening hypoxia decision was made to perform a cricothyrotomy. Cricothyrotomy incision was started via emergency physicians. ENT arrival completing procedure. Initially a cuff 6-0 ET tube was placed. Continued to have poor saturations. ENT decision then to emergently go to operating room for definitive airway. Despite continued hypoxia patient did maintain pressures and pulse while the emergency department. Unclear if this was simply food impaction up there was anaphylaxis. However on our exam patient had  received 0.8 mg of epinephrine which had improved some of the oral tracheal swelling.    Final diagnoses:  Surgery, elective  Anaphylaxis  PEA (Pulseless electrical activity)        Bridgett Larsson, MD 2014-10-24 1545  Pricilla Loveless, MD 10/09/14 4782

## 2014-10-23 NOTE — Transfer of Care (Signed)
Immediate Anesthesia Transfer of Care Note  Patient: Jessica Chung  Procedure(s) Performed: Procedure(s): TRACHEOSTOMY REVISION (N/A)  Patient Location: PACU and ICU  Anesthesia Type:General  Level of Consciousness: unresponsive  Airway & Oxygen Therapy: Patient remains intubated per anesthesia plan and Patient placed on Ventilator (see vital sign flow sheet for setting)  Post-op Assessment: Report given to RN and Post -op Vital signs reviewed and unstable, Anesthesiologist notified  Post vital signs: Reviewed and unstable  Last Vitals:  Filed Vitals:   11/04/2014 1338  BP: 120/79  Pulse: 130  Resp: 0    Complications: No apparent anesthesia complications

## 2014-10-23 NOTE — Progress Notes (Addendum)
Patient came in to ED via EMS being manually ventilated via BVM SATS in 50-60%, due to chocking while eating lunch CRNA attempted to intubate X3 without success, I continued to manually ventilate patient while MD performed a cricothyroidotomy  at the bed side in trauma room, MD placed a 6.0 ETT in stoma, there was a good color change on CO2 detector,equal coarse rhonchi BBS ausculted over lung fields, suctioned blood and food particles from trach, then CRNA took over manual ventilation and rushed patient to OR.

## 2014-10-23 NOTE — Progress Notes (Signed)
54 y/o F with PMH of hypothyroidism, depression, multiple sclerosis and DVT resident of  a SNF presented to ER emergently after an anaphylaxis event.  She reportedly had an anaphylactic reaction after eating chicken & rice.  EMS was activated and were unsuccessful at intubation due to airway swelling.  Emergent trach was attempted per EMS.   On arrival to ER, pt was emergently trached per ENT and then taken to the OR.  She was transferred to MICU post trach.  Exam on arrival was notable for blue hue to skin, pupils fixed and dilated, rhonchi bilaterally, abd soft, distant heart tones.  Trach with bloody secretions from site.  Family updated on status and critical nature of current illness.  They requested full comfort care.     Plan: DNR  Morphine 5mg  Q1 hour PRN for increased WOB / pain  Family at bedside Full comfort measures    Canary Brim, NP-C [facility] Pgr: [phone] or [phone]  STAFF NOTE: I, Rory Percy, MD FACP have personally reviewed patient's available data, including medical history, events of note, physical examination and test results as part of my evaluation. I have discussed with resident/NP and other care providers such as pharmacist, RN and RRT. In addition, I personally evaluated patient and elicited key findings of:  Arrived with sats 11%, horrible case of airway aspiration / obstruction leading to attempts and then surgical airway and revision. Arrested twice en route to OR and in OR, heroic efforts from Anesthesia and ENT, have altered vent to attempt to improve O2 needs, PCV attempted ratio 2:1, failed, then to [facility] peep 22, keeping hiest rate possible, poor prognosis, fixed and dilated, epi has been maxed. D/w sister, does not want heroics and wants comfort care now, will provide morphine and dc vent, ensure comfort. The patient is critically ill with multiple organ systems failure and requires high complexity decision making for assessment and  support, frequent evaluation and titration of therapies, application of advanced monitoring technologies and extensive interpretation of multiple databases.   Critical Care Time devoted to patient care services described in this note is30 Minutes. This time reflects time of care of this signee: Rory Percy, MD FACP. This critical care time does not reflect procedure time, or teaching time or supervisory time of PA/NP/Med student/Med Resident etc but could involve care discussion time. Rest per NP/medical resident whose note is outlined above and that I agree with   Mcarthur Rossetti. Tyson Alias, MD, FACP Pgr: [phone] [facility] 25-Oct-2014 3:50 PM

## 2014-10-23 NOTE — Progress Notes (Signed)
Pt arrived from OR - unresponsive, SR on monitor, face mottled and dusky, being bagged.  Large amount of bloody secretions coming from trach tube and nose.  Pupils 5 and fixed bilaterally.  No spontaneous movement.   Sats reading <20.  Po2 on gas 40.  Dr. Noreene Larsson in attendance.  Dr. Tyson Alias was consulted and arrived at bedside within 5 minutes.  Pt's BP initially > 100 systolic on and epi drip at 5 mcg.  BP decreased steadily despite titrating epi drip up.  HR decreased. Pt was made a DNR.   At 1550, patient was asystole, no respiratory effort or breath sounds, no heart sounds.  Dr. Tyson Alias was present.

## 2014-10-23 NOTE — Consult Note (Signed)
Reason for Consult: Airway emergency Referring Physician: Pricilla Loveless, MD  HPI:  Jessica Chung is an 54 y.o. female came in to ED via EMS being manually ventilated via BVM SATS in 50-60%, due to chocking while eating lunch. EMS attempted intubation and cricothyroidotomy en route, and was unsuccessful.  CRNA attempted to intubate X3 without success in the ER. ENT consulted for airway management.   Past Medical History  Diagnosis Date  . MS (multiple sclerosis)   . Hypothyroid   . Depression   . DVT (deep venous thrombosis)     No past surgical history on file.  No family history on file.  Social History:  reports that she has never smoked. She does not have any smokeless tobacco history on file. She reports that she does not drink alcohol. Her drug history is not on file.  Allergies: Not on File  Prior to Admission medications   Medication Sig Start Date End Date Taking? Authorizing Provider  aspirin 81 MG tablet Take 81 mg by mouth daily.    Historical Provider, MD  baclofen (LIORESAL) 20 MG tablet Take 20 mg by mouth at bedtime.    Historical Provider, MD  Calcium Carbonate-Vitamin D 600-400 MG-UNIT per tablet Take 1 tablet by mouth.    Historical Provider, MD  Cranberry Fruit Concentrate 12600 MG CAPS Take 50,000 mg by mouth daily.    Historical Provider, MD  diazepam (VALIUM) 5 MG tablet Take 5 mg by mouth daily.    Historical Provider, MD  docusate sodium (COLACE) 100 MG capsule Take 100 mg by mouth 2 (two) times daily.    Historical Provider, MD  FLUoxetine (PROZAC) 10 MG capsule Take 10 mg by mouth daily.    Historical Provider, MD  glatiramer (COPAXONE) 20 MG/ML SOSY injection Inject 20 mg into the skin daily.    Historical Provider, MD  levothyroxine (SYNTHROID, LEVOTHROID) 150 MCG tablet Take 150 mcg by mouth daily before breakfast.    Historical Provider, MD  Melatonin 3 MG CAPS Take by mouth at bedtime.    Historical Provider, MD  Multiple Vitamins-Minerals (MULTIVITAMIN  WITH MINERALS) tablet Take 1 tablet by mouth daily.    Historical Provider, MD  Omega-3 Fatty Acids (FISH OIL) 1000 MG CAPS Take by mouth daily.    Historical Provider, MD  polyethylene glycol (MIRALAX / GLYCOLAX) packet Take 17 g by mouth daily.    Historical Provider, MD  warfarin (COUMADIN) 10 MG tablet Take 10 mg by mouth daily.     Historical Provider, MD  warfarin (COUMADIN) 2.5 MG tablet Take 2.5 mg by mouth daily. Mon and Fri for total 12.5 mg    Historical Provider, MD  Wheat Dextrin (BENEFIBER) POWD Take by mouth 3 (three) times daily.    Historical Provider, MD    Medications:  I have reviewed the patient's current medications. Prior to Admission:  Prescriptions prior to admission  Medication Sig Dispense Refill Last Dose  . aspirin 81 MG tablet Take 81 mg by mouth daily.     . baclofen (LIORESAL) 20 MG tablet Take 20 mg by mouth at bedtime.     . Calcium Carbonate-Vitamin D 600-400 MG-UNIT per tablet Take 1 tablet by mouth.     . Cranberry Fruit Concentrate 12600 MG CAPS Take 50,000 mg by mouth daily.     . diazepam (VALIUM) 5 MG tablet Take 5 mg by mouth daily.     Marland Kitchen docusate sodium (COLACE) 100 MG capsule Take 100 mg by mouth 2 (two) times  daily.     Marland Kitchen FLUoxetine (PROZAC) 10 MG capsule Take 10 mg by mouth daily.     Marland Kitchen glatiramer (COPAXONE) 20 MG/ML SOSY injection Inject 20 mg into the skin daily.     Marland Kitchen levothyroxine (SYNTHROID, LEVOTHROID) 150 MCG tablet Take 150 mcg by mouth daily before breakfast.     . Melatonin 3 MG CAPS Take by mouth at bedtime.     . Multiple Vitamins-Minerals (MULTIVITAMIN WITH MINERALS) tablet Take 1 tablet by mouth daily.     . Omega-3 Fatty Acids (FISH OIL) 1000 MG CAPS Take by mouth daily.     . polyethylene glycol (MIRALAX / GLYCOLAX) packet Take 17 g by mouth daily.     Marland Kitchen warfarin (COUMADIN) 10 MG tablet Take 10 mg by mouth daily.      Marland Kitchen warfarin (COUMADIN) 2.5 MG tablet Take 2.5 mg by mouth daily. Mon and Fri for total 12.5 mg   Taking  . Wheat  Dextrin (BENEFIBER) POWD Take by mouth 3 (three) times daily.       No results found for this or any previous visit (from the past 48 hour(s)).  No results found.  ROS: Unable to obtain.   Blood pressure 120/79, pulse 130, resp. rate 0. Cyanotic female, not responsive. Oral cavity with edematous tongue and oropharynx. Mildly obese neck. Laryngeal framework palpable.  Procedure:  emergency cricothyroidotomy  Anesthesia: None Indication: Severe airway obstruction  Description: Through a vertical neck incision, the cricoid and the thyroid cartilages are identified. Using a #11 blade, an incision was made into the cricothyroid membrane. A #6 ET tube was inserted through the opening into the trachea. Good CO2 return was noted. A large amount of food particles and fluid were suctioned from the endotracheal tube.   Assessment/Plan: Severe airway obstruction secondary to food aspiration. An emergency cricothyroidotomy was performed in the ER. Pt will be transported to the OR for tracheostomy and further assessment of her airway.  Mandy Peeks,SUI W 10-10-2014, 2:43 PM

## 2014-10-23 NOTE — Progress Notes (Signed)
RT note- Called to OR for ventilator. Patient in the OR for emergency Tracheostomy. Unable to ventilate well without using high pressure. Initial setting was Pressure control of 35 and PEEP 10, return volumes of 400 ml's. ABG done and sent to respiratory department. Patient taken to 2MW-13. Patient was made a DNR, decreased B/P, bradycardia. Patient was then removed from ventilator per Dr. Tyson Alias. Family at bedside.

## 2014-10-23 NOTE — Progress Notes (Signed)
   Oct 14, 2014 1400  Clinical Encounter Type  Visited With Family;Health care provider  Visit Type Initial;Spiritual support;Social support  Stress Factors  Family Stress Factors Family relationships;Health changes;Lack of knowledge;Loss of control;Major life changes   Chaplain was paged to the ED at 1:55 PM. Chaplain was notified that the patient had been admitted from another facility with respiratory distress. When chaplain arrived, patient had already been sent to the OR but no one had seen any family yet. Chaplain checked out in the ED lobby and the patient's sister was there. Chaplain introduced himself to the patient's sister and let her know that her sister was sent to the OR. Chaplain escorted patient's sister to consultation room B to see if an ED physician would be able to update the sister. Chaplain was present while ED physician updated patient's sister of the situation. Patient's sister was teary but seemed to understand and accept what was happening. Patient's sister later explained that her sister has had medical issues most of her life. Patient's sister was living in [place], where their parents live. In [place], patient was in an assisted living facility but the patient's dementia worsened and cognitive functioning declined to the point where she needed a facility that provided more care. Patient thus moved to [facility], near the sister, and became a patient of a rehab facility. Patient's sister was really worried about telling her parents what has happened. Patient's sister explained that in [place] the patient had a DNR order and her parents would not have wanted invasive life-prolonging care. Patient's sister understands that the hospital had to employ emergency protocol under the circumastances but is really worried that her family is going to blame her. Chaplain escorted patient's sister to surgical waiting room, where a volunteer gave her all the information she needs regarding the  patient's surgery. The sister's partner has also arrived to the hospital and has been really supportive of the sister during this time. Page Merrilyn Puma chaplain if patient or patient's family needs further support.  Arvil Utz, Tommi Emery, Chaplain  2:50 PM

## 2014-10-23 DEATH — deceased

## 2016-10-14 IMAGING — CR DG CERVICAL SPINE 1V
1 series · 1 of 1 positions shown · non-contrast
Comparison: Chest x-ray 10/07/2014

CLINICAL DATA: 54-year-old female with surgical history of
tracheostomy.

EXAM:
DG CERVICAL SPINE - 1 VIEW

[AP]
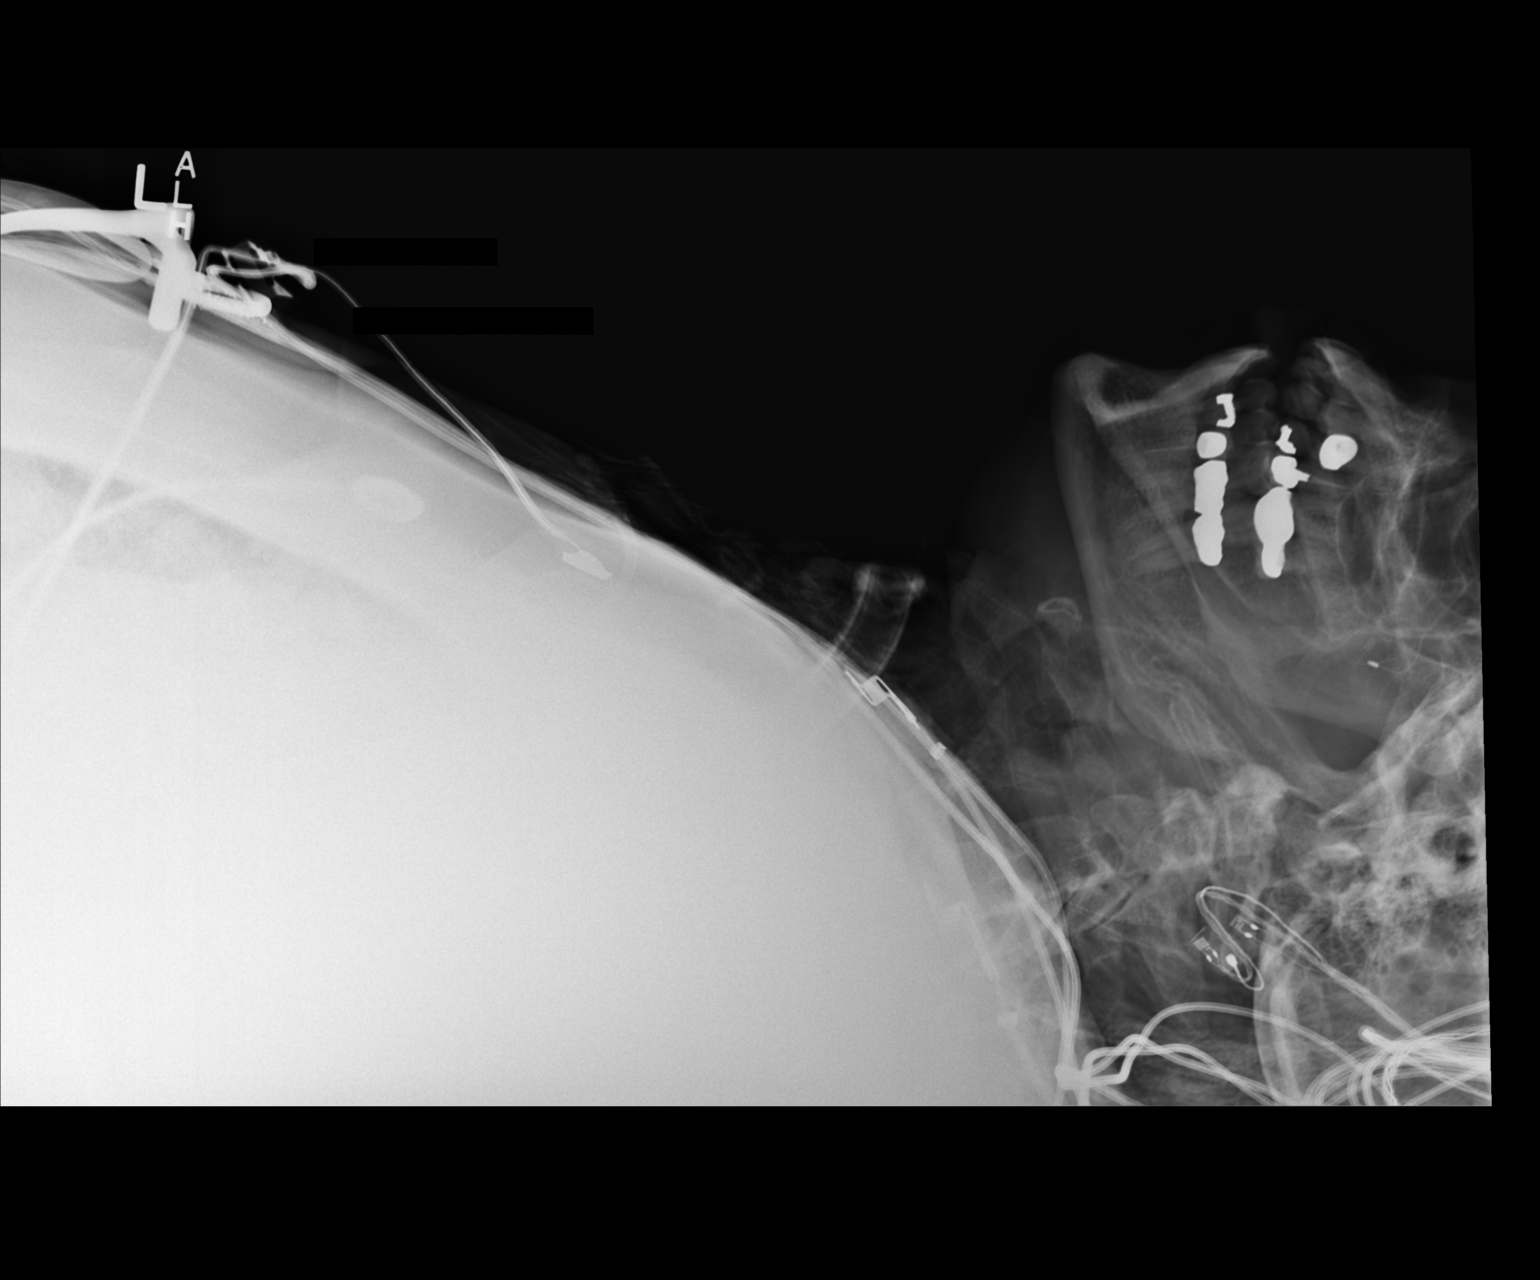

[1 of 1 positions shown; findings below may reference images not displayed]

FINDINGS: Single operative cross-table lateral of the upper chest and cervical
spine.

Tracheostomy tube projects at the anterior neck. Terminal aspect of
the tracheostomy tube not well visualized secondary to overlying
soft tissues of the chest.
IMPRESSION: Limited cross-table lateral of the neck demonstrates tracheostomy
tube within the anterior cervical soft tissues. The terminal aspect
of the tube is not well evaluated secondary to overlying soft
tissues of the chest wall.

Please refer to the dictated operative report for full details of
intraoperative findings and procedure.
